# Patient Record
Sex: Male | Born: 1985 | Hispanic: Yes | Marital: Married
Health system: Southern US, Community
[De-identification: ages and names within clinical notes are randomized; demographics above are authoritative.]

---

## 2019-10-02 ENCOUNTER — Emergency Department (HOSPITAL_COMMUNITY)
Admission: EM | Admit: 2019-10-02 | Discharge: 2019-10-02 | Disposition: A | Discharge status: Home / Self Care | Payer: Self-pay | Attending: Emergency Medicine | Admitting: Emergency Medicine

## 2019-10-02 ENCOUNTER — Encounter (HOSPITAL_COMMUNITY): Payer: Self-pay

## 2019-10-02 ENCOUNTER — Other Ambulatory Visit: Payer: Self-pay

## 2019-10-02 ENCOUNTER — Emergency Department (HOSPITAL_COMMUNITY): Payer: Self-pay

## 2019-10-02 DIAGNOSIS — L988 Other specified disorders of the skin and subcutaneous tissue: Secondary | ICD-10-CM

## 2019-10-02 DIAGNOSIS — N5089 Other specified disorders of the male genital organs: Secondary | ICD-10-CM | POA: Insufficient documentation

## 2019-10-02 DIAGNOSIS — Z23 Encounter for immunization: Secondary | ICD-10-CM | POA: Insufficient documentation

## 2019-10-02 DIAGNOSIS — Z8719 Personal history of other diseases of the digestive system: Secondary | ICD-10-CM | POA: Insufficient documentation

## 2019-10-02 LAB — COMPREHENSIVE METABOLIC PANEL
ALT: 78 U/L — ABNORMAL HIGH (ref 0–44)
AST: 37 U/L (ref 15–41)
Albumin: 4.7 g/dL (ref 3.5–5.0)
Alkaline Phosphatase: 75 U/L (ref 38–126)
Anion gap: 10 (ref 5–15)
BUN: 18 mg/dL (ref 6–20)
CO2: 23 mmol/L (ref 22–32)
Calcium: 9.3 mg/dL (ref 8.9–10.3)
Chloride: 106 mmol/L (ref 98–111)
Creatinine, Ser: 0.92 mg/dL (ref 0.61–1.24)
GFR calc Af Amer: >60 mL/min (ref >60)
GFR calc non Af Amer: >60 mL/min (ref >60)
Glucose, Bld: 96 mg/dL (ref 70–99)
Potassium: 4.1 mmol/L (ref 3.5–5.1)
Sodium: 139 mmol/L (ref 135–145)
Total Bilirubin: 0.3 mg/dL (ref 0.3–1.2)
Total Protein: 8.3 g/dL — ABNORMAL HIGH (ref 6.5–8.1)

## 2019-10-02 LAB — CBC WITH DIFFERENTIAL/PLATELET
Abs Immature Granulocytes: 0.02 10*3/uL (ref 0.00–0.07)
Basophils Absolute: 0 10*3/uL (ref 0.0–0.1)
Basophils Relative: 0 %
Eosinophils Absolute: 0.1 10*3/uL (ref 0.0–0.5)
Eosinophils Relative: 1 %
HCT: 45 % (ref 39.0–52.0)
Hemoglobin: 15.2 g/dL (ref 13.0–17.0)
Immature Granulocytes: 0 %
Lymphocytes Relative: 23 %
Lymphs Abs: 2.6 10*3/uL (ref 0.7–4.0)
MCH: 31.1 pg (ref 26.0–34.0)
MCHC: 33.8 g/dL (ref 30.0–36.0)
MCV: 92 fL (ref 80.0–100.0)
Monocytes Absolute: 0.5 10*3/uL (ref 0.1–1.0)
Monocytes Relative: 4 %
Neutro Abs: 8.4 10*3/uL — ABNORMAL HIGH (ref 1.7–7.7)
Neutrophils Relative %: 72 %
Platelets: 254 10*3/uL (ref 150–400)
RBC: 4.89 MIL/uL (ref 4.22–5.81)
RDW: 11.7 % (ref 11.5–15.5)
WBC: 11.7 10*3/uL — ABNORMAL HIGH (ref 4.0–10.5)
nRBC: 0 % (ref 0.0–0.2)

## 2019-10-02 MED ORDER — SODIUM CHLORIDE (PF) 0.9 % IJ SOLN
INTRAMUSCULAR | Status: AC
  Filled 2019-10-02: qty 50

## 2019-10-02 MED ORDER — FENTANYL CITRATE (PF) 100 MCG/2ML IJ SOLN
50.0000 ug | Freq: Once | INTRAMUSCULAR | Status: AC
  Administered 2019-10-02: 50 ug via INTRAVENOUS

## 2019-10-02 MED ORDER — FENTANYL CITRATE (PF) 100 MCG/2ML IJ SOLN
50.0000 ug | Freq: Once | INTRAMUSCULAR | Status: DC
  Filled 2019-10-02: qty 2

## 2019-10-02 MED ORDER — HYDROCODONE-ACETAMINOPHEN 5-325 MG PO TABS: 1.0000 | ORAL_TABLET | Freq: Four times a day (QID) | ORAL | 0 refills | Status: AC | PRN

## 2019-10-02 MED ORDER — IOHEXOL 300 MG/ML  SOLN
100.0000 mL | Freq: Once | INTRAMUSCULAR | Status: AC | PRN
  Administered 2019-10-02: 100 mL via INTRAVENOUS

## 2019-10-02 MED ORDER — SODIUM CHLORIDE 0.9 % IV SOLN: Freq: Once | INTRAVENOUS | Status: DC

## 2019-10-02 MED ORDER — TETANUS-DIPHTH-ACELL PERTUSSIS 5-2.5-18.5 LF-MCG/0.5 IM SUSP
0.5000 mL | Freq: Once | INTRAMUSCULAR | Status: AC
  Administered 2019-10-02: 0.5 mL via INTRAMUSCULAR
  Filled 2019-10-02: qty 0.5

## 2019-10-02 MED ORDER — HYDROCODONE-ACETAMINOPHEN 5-325 MG PO TABS
1.0000 | ORAL_TABLET | Freq: Once | ORAL | Status: AC
  Administered 2019-10-02: 1 via ORAL
  Filled 2019-10-02: qty 1

## 2019-10-02 MED ORDER — AMOXICILLIN-POT CLAVULANATE 875-125 MG PO TABS
1.0000 | ORAL_TABLET | Freq: Once | ORAL | Status: AC
  Administered 2019-10-02: 1 via ORAL
  Filled 2019-10-02: qty 1

## 2019-10-02 MED ORDER — AMOXICILLIN-POT CLAVULANATE 875-125 MG PO TABS: 1.0000 | ORAL_TABLET | Freq: Two times a day (BID) | ORAL | 2 refills | Status: AC

## 2019-10-02 NOTE — ED Provider Notes (Signed)
[facility]   CSN: [id number] Arrival date & time: 10/02/19  1621     History Chief Complaint  Patient presents with  . Abscess    Ronald Gates is a 34 y.o. male.  Past medical history of a rectal the scrotal fistula who presents today for evaluation of pain.  He reports that previously he has had multiple surgeries for a perirectal fistula with a seton placed 1 year and 3 months ago. He reports that he had been doing well since, however his scrotum on the left side began swelling yesterday and when he got here he felt it pop and started to drain.  He denies any fevers.  He does not have a history of Crohn's disease.  He denies history of diabetes.  He denies any fevers, nausea vomiting.  He denies any allergies and does not take any medications. His last tetanus shot was reportedly when he was in high school. He has previously been seen for this at [facility] in [place].  There he was reportedly referred to rectal specialist [place] in [place] who placed the seton.  Patient thinks that he may still have packing internally however is unsure.  When I asked him why he says that his father at one point had packing left in and developed an abscess.  History obtained through professional Spanish-speaking medical interpreter.     HPI     History reviewed. No pertinent past medical history.  Patient Active Problem List   Diagnosis Date Noted  . Fistula 10/02/2019    History reviewed. No pertinent surgical history.     History reviewed. No pertinent family history.  Social History   Tobacco Use  . Smoking status: Not on file  Substance Use Topics  . Alcohol use: Not on file  . Drug use: Not on file    Home Medications Prior to Admission medications   Medication Sig Start Date End Date Taking? Authorizing Provider  amoxicillin-clavulanate (AUGMENTIN) 875-125 MG tablet Take 1 tablet by mouth every 12  (twelve) hours for 5 days. 10/02/19 10/07/19  Cristina Gong, PA-C  HYDROcodone-acetaminophen (NORCO/VICODIN) 5-325 MG tablet Take 1 tablet by mouth every 6 (six) hours as needed for severe pain. 10/02/19   Cristina Gong, PA-C    Allergies    Patient has no known allergies.  Review of Systems   Review of Systems  Constitutional: Negative for chills and fever.  Respiratory: Negative for chest tightness and shortness of breath.   Gastrointestinal: Negative for abdominal pain, diarrhea, nausea and vomiting.  Genitourinary: Positive for scrotal swelling (and bleeding). Negative for difficulty urinating, testicular pain and urgency.  Musculoskeletal: Negative for back pain and neck pain.  Neurological: Negative for weakness and headaches.  Psychiatric/Behavioral: Negative for confusion.  All other systems reviewed and are negative.   Physical Exam Updated Vital Signs BP 124/75   Pulse 70   Temp 98.7 F (37.1 C) (Oral)   Resp 18   SpO2 95%   Physical Exam Vitals and nursing note reviewed. Exam conducted with a chaperone present.  Constitutional:      General: He is not in acute distress.    Appearance: He is well-developed. He is not diaphoretic.  HENT:     Head: Normocephalic and atraumatic.  Eyes:     General: No scleral icterus.       Right eye: No discharge.        Left eye: No discharge.  Conjunctiva/sclera: Conjunctivae normal.  Cardiovascular:     Rate and Rhythm: Normal rate and regular rhythm.  Pulmonary:     Effort: Pulmonary effort is normal. No respiratory distress.     Breath sounds: No stridor.  Abdominal:     General: There is no distension.  Genitourinary:    Comments: There is a wound with bleeding but appears to be spontaneous drainage of the recorded abscess from the left sided scrotum. There is surrounding edema. There is a red loop present near the rectum and the perineum. There is no significant surrounding erythema, no subcutaneous  emphysema or crepitus palpated. Musculoskeletal:        General: No deformity.     Cervical back: Normal range of motion and neck supple.  Skin:    General: Skin is warm and dry.  Neurological:     Mental Status: He is alert.     Motor: No abnormal muscle tone.  Psychiatric:        Behavior: Behavior normal.     ED Results / Procedures / Treatments   Labs (all labs ordered are listed, but only abnormal results are displayed) Labs Reviewed  COMPREHENSIVE METABOLIC PANEL - Abnormal; Notable for the following components:      Result Value   Total Protein 8.3 (*)    ALT 78 (*)    All other components within normal limits  CBC WITH DIFFERENTIAL/PLATELET - Abnormal; Notable for the following components:   WBC 11.7 (*)    Neutro Abs 8.4 (*)    All other components within normal limits    EKG None  Radiology CT PELVIS W CONTRAST  Result Date: 10/02/2019 CLINICAL DATA:  Recurrent perirectal abscess. Recent surgical drainage. EXAM: CT PELVIS WITH CONTRAST TECHNIQUE: Multidetector CT imaging of the pelvis was performed using the standard protocol following the bolus administration of intravenous contrast. CONTRAST:  137mL OMNIPAQUE IOHEXOL 300 MG/ML  SOLN COMPARISON:  None. FINDINGS: Lower Urinary Tract: Normal appearance of urinary bladder. Bowel: Intrapelvic bowel loops are unremarkable in appearance. A seton is seen in the left perianal region. No perianal abscess identified. A fistula is seen extending anteriorly from inferior aspect of the anal canal, into the perineum and base of the left hemiscrotum. A small rim enhancing fluid collection with internal air bubble is seen in the left perineum measuring 3.1 x 1.5 cm, consistent with abscess. Vascular/Lymphatic: No pathologically enlarged lymph nodes or other significant abnormality. Reproductive:  No mass or other significant abnormality. Other: None. Musculoskeletal: No significant abnormality identified. IMPRESSION: Delphina Cahill seen in the  left perianal region. Perianal fistula extends anteriorly, with soft tissue abscess in the left perineal soft tissues measuring 3.1 x 1.5 cm. Electronically Signed   By: Marlaine Hind M.D.   On: 10/02/2019 19:24    Procedures Procedures (including critical care time)  Medications Ordered in ED Medications  sodium chloride (PF) 0.9 % injection (has no administration in time range)  iohexol (OMNIPAQUE) 300 MG/ML solution 100 mL (100 mLs Intravenous Contrast Given 10/02/19 1902)  HYDROcodone-acetaminophen (NORCO/VICODIN) 5-325 MG per tablet 1 tablet (1 tablet Oral Given 10/02/19 2140)  amoxicillin-clavulanate (AUGMENTIN) 875-125 MG per tablet 1 tablet (1 tablet Oral Given 10/02/19 2140)  fentaNYL (SUBLIMAZE) injection 50 mcg (50 mcg Intravenous Given 10/02/19 2058)  Tdap (BOOSTRIX) injection 0.5 mL (0.5 mLs Intramuscular Given 10/02/19 2141)    ED Course  I have reviewed the triage vital signs and the nursing notes.  Pertinent labs & imaging results that were available during my care  of the patient were reviewed by me and considered in my medical decision making (see chart for details).  Clinical Course as of Oct 02 2203  Wynelle Link Oct 02, 2019  2058 I spoke with Dr. Michaell Cowing who recommended outpatient follow-up.Recommends discharging patient with Augmentin twice daily for 5 days with 2 refills.  States in addition need to request outside records, patient establish care with a primary care doctor and GI and follow-up as an outpatient.   [EH]    Clinical Course User Index [EH] Norman Clay   MDM Rules/Calculators/A&P                     Patient is a 34 year old man who presents today for evaluation of a fistula. He has a known perirectal fistula that has a seton in place. Over the past year it has occasionally tunneled into the scrotum and today was becoming more swollen and painful and spontaneously started draining shortly after arrival. On exam he has a obvious area of drainage from the  left anterior scrotum.  Clinically exam is not consistent with Fournier's gangrene as there is no significant surrounding redness and no crepitus palpated. CT scan was obtained showing white count is slightly elevated.  Here he does not meet Sirs/sepsis criteria and that he is afebrile, not tachycardic or tachypneic. CT scan of his pelvis was obtained showing concern for an abscess. I spoke with Dr. Michaell Cowing of general surgery.  We discussed patient's history, along with his clinical exam and CT results today.  Dr. Michaell Cowing recommended that patient attempt to obtain his outside medical records, be placed on Augmentin twice daily for 5 days with 2 refills, establish care with a primary care doctor and follow-up with the surgical clinic as an outpatient.  I spoke with patient about these recommendations and plan and he states his understanding. He is given follow-up information with the wellness clinic. He is aware that he needs to get his records from [place] transferred here.  This patient was seen as a shared visit with Dr. Rhunette Croft.  In addition to conservative measures and OTC pain medication patient is given a prescription for short course of narcotic medication to assist in pain control at home as needed.  Return precautions were discussed with patient who states their understanding.  At the time of discharge patient denied any unaddressed complaints or concerns.  Patient is agreeable for discharge home.  Note: Portions of this report may have been transcribed using voice recognition software. Every effort was made to ensure accuracy; however, inadvertent computerized transcription errors may be present  Final Clinical Impression(s) / ED Diagnoses Final diagnoses:  Fistula    Rx / DC Orders ED Discharge Orders         Ordered    amoxicillin-clavulanate (AUGMENTIN) 875-125 MG tablet  Every 12 hours     10/02/19 2134    HYDROcodone-acetaminophen (NORCO/VICODIN) 5-325 MG tablet  Every 6 hours  PRN     10/02/19 2134           Cristina Gong, PA-C 10/02/19 2205    Derwood Kaplan, MD 10/04/19 1827

## 2019-10-02 NOTE — ED Triage Notes (Signed)
Pt reports hx of a recurring abscess below scrotum. Pt reports this time is the worst, and also reports he thinks their is packing still in the area.

## 2019-10-02 NOTE — Discharge Instructions (Addendum)
Please make sure that you request your records from [place] get transferred to [facility]. It is important that you set up an appointment with [facility] to establish care for them to be your primary care doctor and a follow-up appointment with [facility] surgery. Please have your records from [place] for both of these visits. If at any point you develop fevers, your pain is uncontrolled, or you have worsening symptoms please seek additional medical care and evaluation.  Please take Ibuprofen (Advil, motrin) and Tylenol (acetaminophen) to relieve your pain.  You may take up to 600 MG (3 pills) of normal strength ibuprofen every 8 hours as needed.  In between doses of ibuprofen you make take tylenol, up to 1,000 mg (two extra strength pills).  Do not take more than 3,000 mg tylenol in a 24 hour period.  Please check all medication labels as many medications such as pain and cold medications may contain tylenol.  Do not drink alcohol while taking these medications.  Do not take other NSAID'S while taking ibuprofen (such as aleve or naproxen).  Please take ibuprofen with food to decrease stomach upset. Today you received medications that may make you sleepy or impair your ability to make decisions.  For the next 24 hours please do not drive, operate heavy machinery, care for a small child with out another adult present, or perform any activities that may cause harm to you or someone else if you were to fall asleep or be impaired.   You are being prescribed a medication which may make you sleepy. Please follow up of listed precautions for at least 24 hours after taking one dose.  You may have diarrhea from the antibiotics.  It is very important that you continue to take the antibiotics even if you get diarrhea unless a medical professional tells you that you may stop taking them.  If you stop too early the bacteria you are being treated for will become stronger and you may need  different, more powerful antibiotics that have more side effects and worsening diarrhea.  Please stay well hydrated and consider probiotics as they may decrease the severity of your diarrhea.

## 2021-02-25 IMAGING — CT CT PELVIS W/ CM
2 of 3 series · 16 of 46 positions shown, 18 images · IV contrast (omnipaque)
Comparison: None.

CLINICAL DATA: Recurrent perirectal abscess. Recent surgical
drainage.

EXAM:
CT PELVIS WITH CONTRAST
TECHNIQUE: Multidetector CT imaging of the pelvis was performed using the
standard protocol following the bolus administration of intravenous
contrast.
CONTRAST:  100mL OMNIPAQUE IOHEXOL 300 MG/ML  SOLN

[Series 2: axial st · axial · 0.76mm/px · z∈[-687,-402]mm · 13 of 67 slices shown, 15 images]
[im 5/67  soft-tissue]
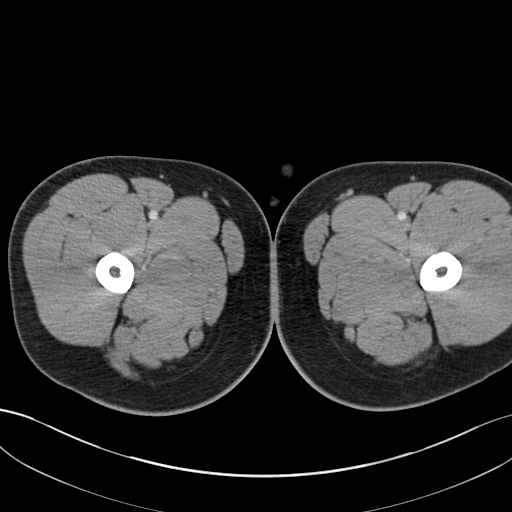
[im 5/67  bone]
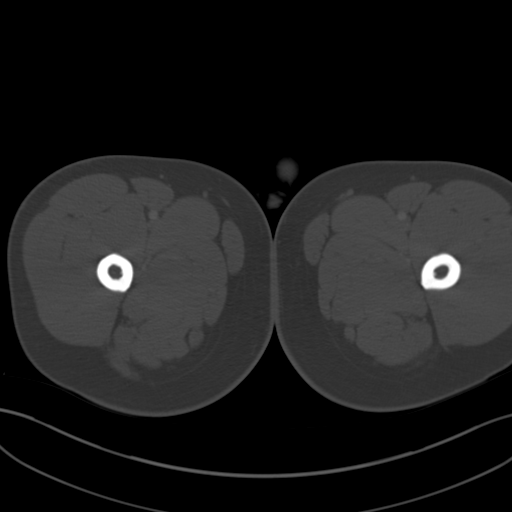
[im 9/67  soft-tissue]
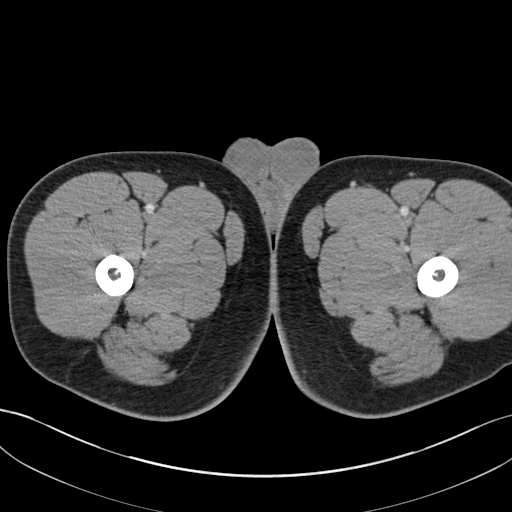
[im 13/67  soft-tissue]
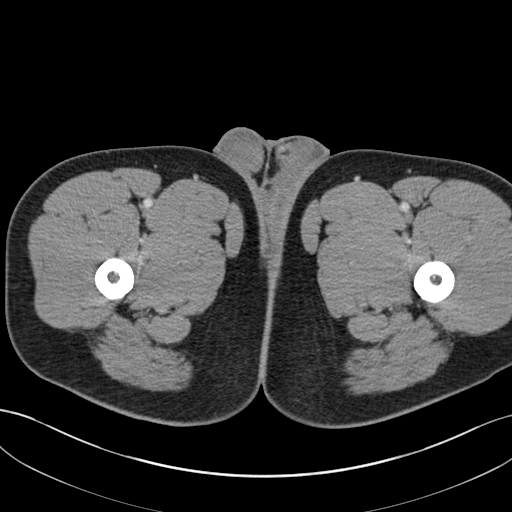
[im 20/67  soft-tissue]
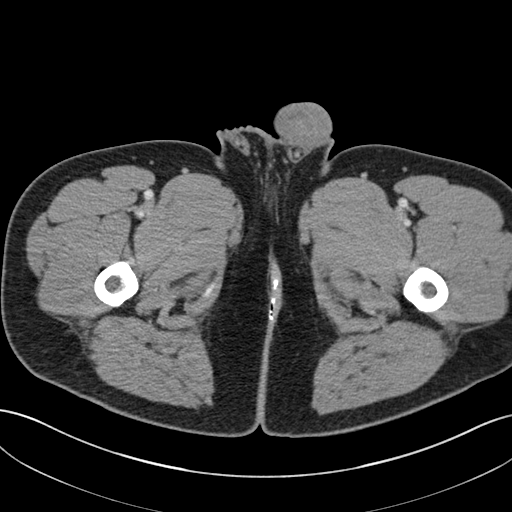
[im 24/67  soft-tissue]
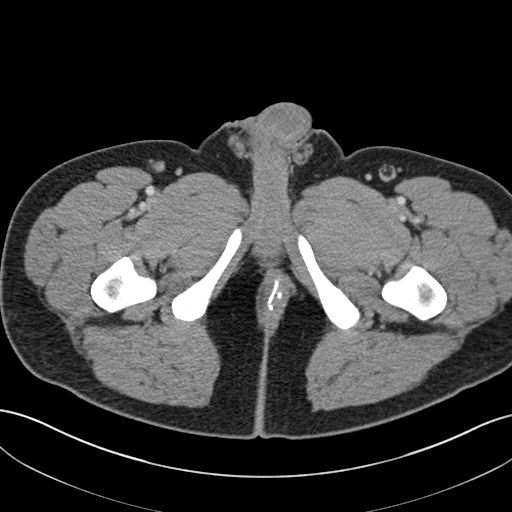
[im 28/67  soft-tissue]
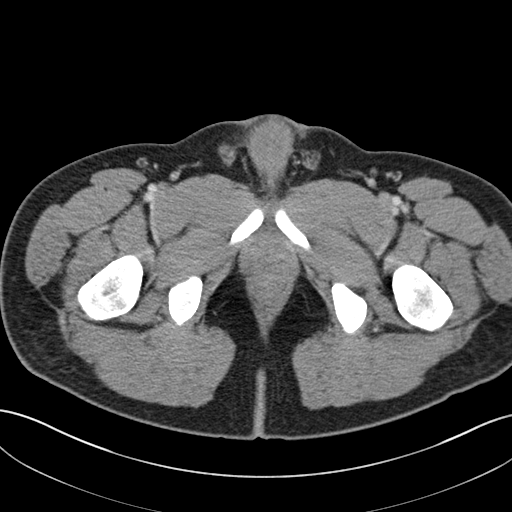
[im 35/67  soft-tissue]
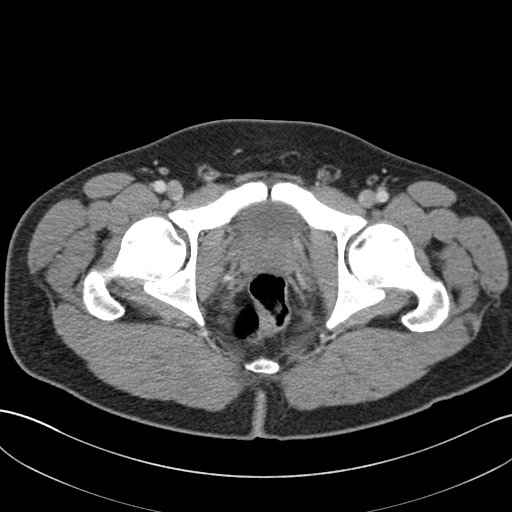
[im 39/67  soft-tissue]
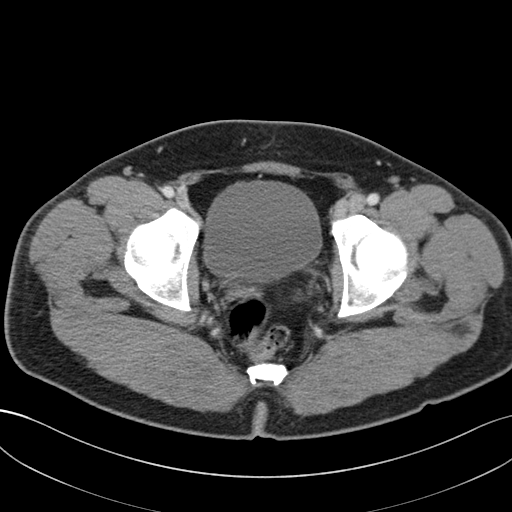
[im 43/67  soft-tissue]
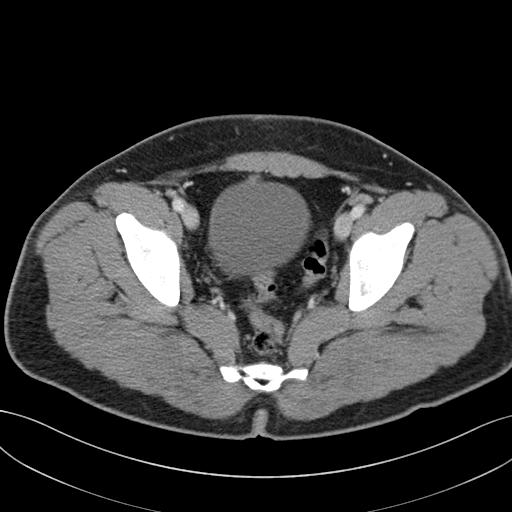
[im 43/67  bone]
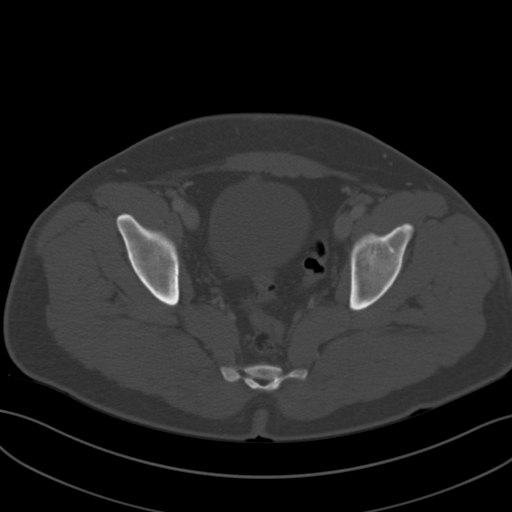
[im 47/67  soft-tissue]
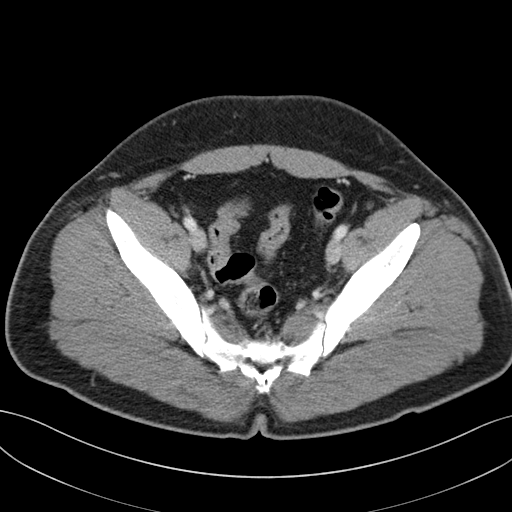
[im 54/67  soft-tissue]
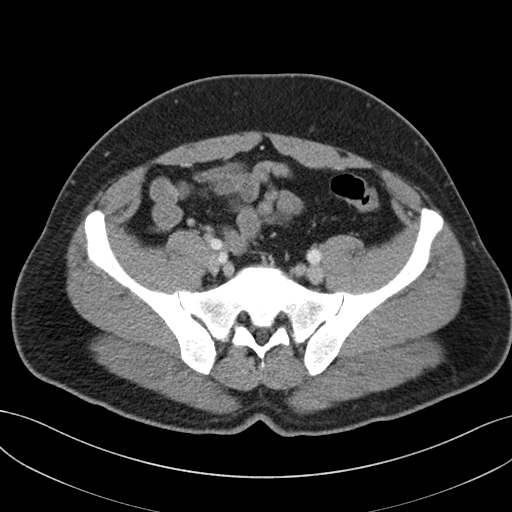
[im 58/67  soft-tissue]
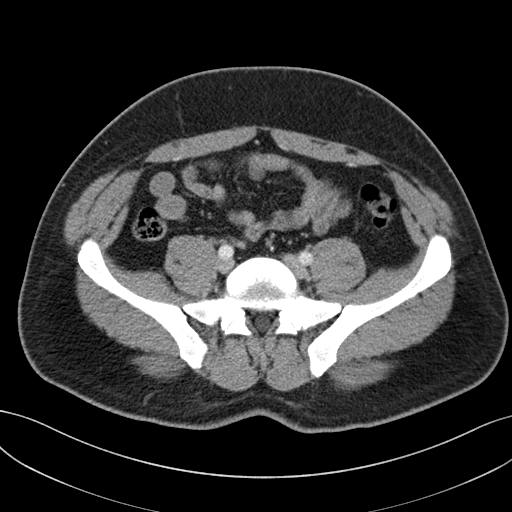
[im 62/67  soft-tissue]
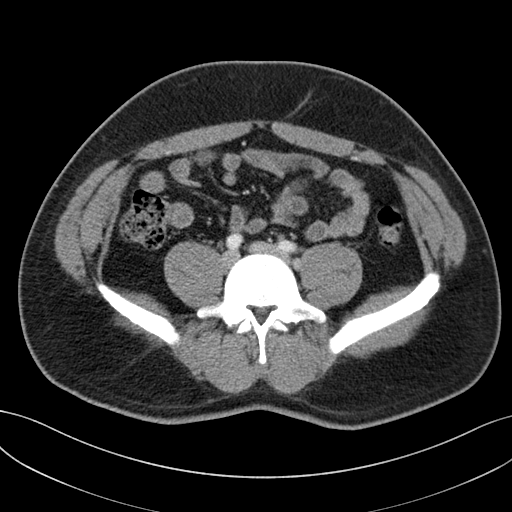

[Series 4: coronal st · coronal · 0.66mm/px · 3 of 151 slices shown]
[im 51/151  soft-tissue]
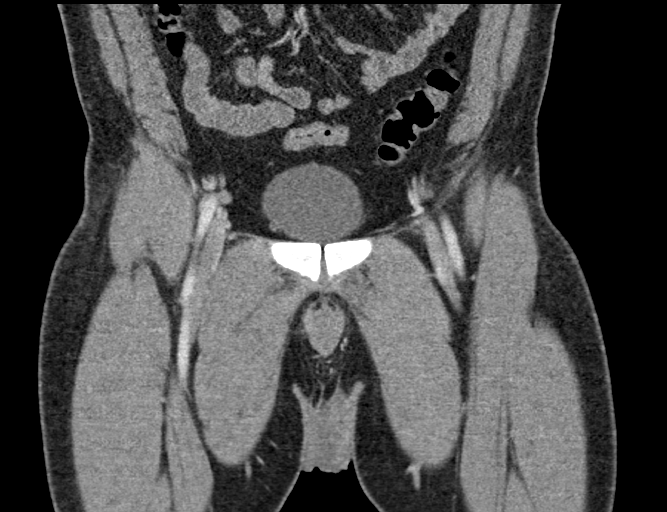
[im 67/151  soft-tissue]
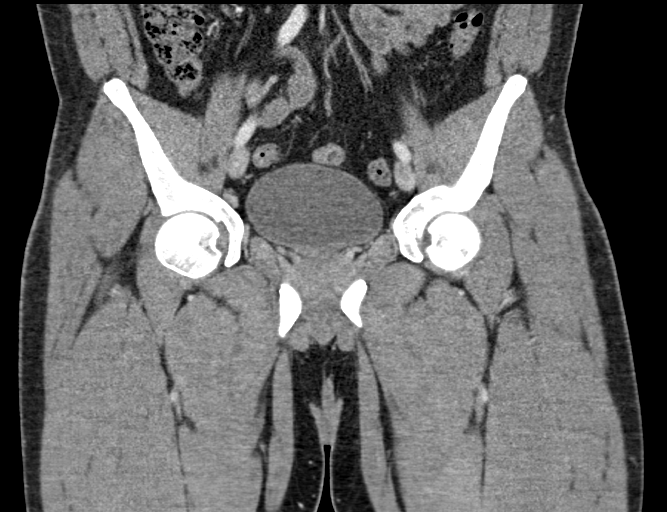
[im 84/151  soft-tissue]
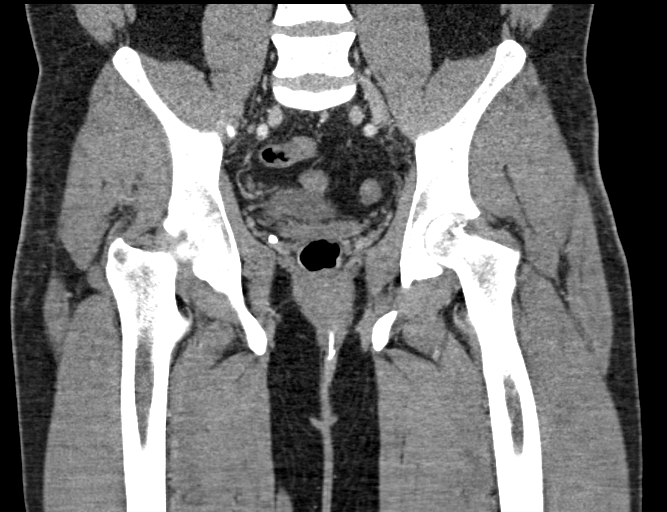

[16 of 46 positions shown; findings below may reference images not displayed]

FINDINGS: Lower Urinary Tract: Normal appearance of urinary bladder.

Bowel: Intrapelvic bowel loops are unremarkable in appearance. A
seton is seen in the left perianal region. No perianal abscess
identified. A fistula is seen extending anteriorly from inferior
aspect of the anal canal, into the perineum and base of the left
hemiscrotum. A small rim enhancing fluid collection with internal
air bubble is seen in the left perineum measuring 3.1 x 1.5 cm,
consistent with abscess.

Vascular/Lymphatic: No pathologically enlarged lymph nodes or other
significant abnormality.

Reproductive:  No mass or other significant abnormality.

Other: None.

Musculoskeletal: No significant abnormality identified.
IMPRESSION: Seton seen in the left perianal region. Perianal fistula extends
anteriorly, with soft tissue abscess in the left perineal soft
tissues measuring 3.1 x 1.5 cm.
